# Patient Record
Sex: Female | Born: 1975 | Race: White | Hispanic: No | Marital: Married | Smoking: Current every day smoker
Health system: Southern US, Community
[De-identification: ages and names within clinical notes are randomized; demographics above are authoritative.]

## PROBLEM LIST (undated history)

## (undated) DIAGNOSIS — M51369 Other intervertebral disc degeneration, lumbar region without mention of lumbar back pain or lower extremity pain: Secondary | ICD-10-CM

## (undated) DIAGNOSIS — I340 Nonrheumatic mitral (valve) insufficiency: Secondary | ICD-10-CM

## (undated) DIAGNOSIS — M5126 Other intervertebral disc displacement, lumbar region: Secondary | ICD-10-CM

## (undated) DIAGNOSIS — O24419 Gestational diabetes mellitus in pregnancy, unspecified control: Secondary | ICD-10-CM

## (undated) DIAGNOSIS — M5136 Other intervertebral disc degeneration, lumbar region: Secondary | ICD-10-CM

## (undated) DIAGNOSIS — M549 Dorsalgia, unspecified: Secondary | ICD-10-CM

## (undated) DIAGNOSIS — M069 Rheumatoid arthritis, unspecified: Secondary | ICD-10-CM

## (undated) DIAGNOSIS — I509 Heart failure, unspecified: Secondary | ICD-10-CM

## (undated) HISTORY — DX: Other intervertebral disc degeneration, lumbar region: M51.36

## (undated) HISTORY — DX: Other intervertebral disc degeneration, lumbar region without mention of lumbar back pain or lower extremity pain: M51.369

## (undated) HISTORY — DX: Other intervertebral disc displacement, lumbar region: M51.26

## (undated) HISTORY — DX: Gestational diabetes mellitus in pregnancy, unspecified control: O24.419

---

## 2002-03-09 HISTORY — PX: CHOLECYSTECTOMY: SHX55

## 2013-10-24 ENCOUNTER — Encounter (HOSPITAL_COMMUNITY): Payer: Self-pay | Admitting: Emergency Medicine

## 2013-10-24 ENCOUNTER — Emergency Department (HOSPITAL_COMMUNITY)
Admission: EM | Admit: 2013-10-24 | Discharge: 2013-10-24 | Disposition: A | Discharge status: Home / Self Care | Payer: Medicaid Other | Attending: Emergency Medicine | Admitting: Emergency Medicine

## 2013-10-24 ENCOUNTER — Emergency Department (HOSPITAL_COMMUNITY): Payer: Medicaid Other

## 2013-10-24 DIAGNOSIS — R05 Cough: Secondary | ICD-10-CM | POA: Diagnosis present

## 2013-10-24 DIAGNOSIS — Z880 Allergy status to penicillin: Secondary | ICD-10-CM | POA: Insufficient documentation

## 2013-10-24 DIAGNOSIS — J069 Acute upper respiratory infection, unspecified: Secondary | ICD-10-CM | POA: Diagnosis not present

## 2013-10-24 DIAGNOSIS — Z720 Tobacco use: Secondary | ICD-10-CM | POA: Insufficient documentation

## 2013-10-24 HISTORY — DX: Dorsalgia, unspecified: M54.9

## 2013-10-24 MED ORDER — BENZONATATE 100 MG PO CAPS: 100.0000 mg | ORAL_CAPSULE | Freq: Three times a day (TID) | ORAL | Status: DC

## 2013-10-24 MED ORDER — ALBUTEROL SULFATE HFA 108 (90 BASE) MCG/ACT IN AERS: 2.0000 | INHALATION_SPRAY | RESPIRATORY_TRACT | Status: AC | PRN

## 2013-10-24 NOTE — ED Notes (Signed)
MD at bedside. 

## 2013-10-24 NOTE — ED Provider Notes (Signed)
CSN: [id number]     Arrival date & time 10/24/13  1711 History  This chart was scribed for Kristine Mckenzie, * by Modena Jansky, ED Scribe. This patient was seen in room APA05/[facility]05 and the patient's care was started at 5:23 PM.   Chief Complaint  Patient presents with  . Cough   The history is provided by the patient. No language interpreter was used.   HPI Comments: Kristine Mckenzie is a 38 y.o. female who presents to the Emergency Department complaining of moderate intermittent cough that started about 3 days. She reports that the cough is productive of yellow/green sputum. She states that she has associated wheezing and constant moderate rib pain that radiates to her back. She reports no modifying factors. She denies any hx of asthma or respiratory problems.   Past Medical History  Diagnosis Date  . Back pain    Past Surgical History  Procedure Laterality Date  . Cholecystectomy     History reviewed. No pertinent family history. History  Substance Use Topics  . Smoking status: Current Every Day Smoker -- 1.00 packs/day    Types: Cigarettes  . Smokeless tobacco: Not on file  . Alcohol Use: No   OB History   Grav Para Term Preterm Abortions TAB SAB Ect Mult Living                 Review of Systems  Respiratory: Positive for cough and wheezing.   Musculoskeletal: Positive for back pain and myalgias.  All other systems reviewed and are negative.   Allergies  Codeine and Penicillins  Home Medications   Prior to Admission medications   Not on File   BP 143/81  Pulse 100  Temp(Src) 99.2 F (37.3 C) (Oral)  Resp 100  Ht 5\' 4"  (1.626 m)  Wt 240 lb (108.863 kg)  BMI 41.18 kg/m2  SpO2 100%  LMP 10/20/2013 Physical Exam  Nursing note and vitals reviewed. Constitutional: She is oriented to person, place, and time. She appears well-developed and well-nourished. No distress.  HENT:  Head: Normocephalic and atraumatic.  Right Ear: Hearing normal.  Left Ear:  Hearing normal.  Nose: Nose normal.  Mouth/Throat: Oropharynx is clear and moist and mucous membranes are normal.  Eyes: Conjunctivae and EOM are normal. Pupils are equal, round, and reactive to light.  Neck: Normal range of motion. Neck supple.  Cardiovascular: Regular rhythm, S1 normal and S2 normal.  Exam reveals no gallop and no friction rub.   No murmur heard. Pulmonary/Chest: Effort normal and breath sounds normal. No respiratory distress. She has no wheezes. She exhibits no tenderness.  Abdominal: Soft. Normal appearance and bowel sounds are normal. There is no hepatosplenomegaly. There is no tenderness. There is no rebound, no guarding, no tenderness at McBurney's point and negative Murphy's sign. No hernia.  Musculoskeletal: Normal range of motion.  Neurological: She is alert and oriented to person, place, and time. She has normal strength. No cranial nerve deficit or sensory deficit. Coordination normal. GCS eye subscore is 4. GCS verbal subscore is 5. GCS motor subscore is 6.  Skin: Skin is warm, dry and intact. No rash noted. No cyanosis.  Psychiatric: She has a normal mood and affect. Her speech is normal and behavior is normal. Thought content normal.    ED Course  Procedures (including critical care time) DIAGNOSTIC STUDIES: Oxygen Saturation is 100% on RA, normal by my interpretation.    COORDINATION OF CARE: 5:27 PM- Pt advised of plan for treatment which includes  radiology and pt agrees.  Labs Review Labs Reviewed - No data to display  Imaging Review Dg Chest 2 View  10/24/2013   CLINICAL DATA:  Productive cough for the past 2 days. Smoker. Fever.  EXAM: CHEST  2 VIEW  COMPARISON:  None.  FINDINGS: Normal sized heart. Clear lungs. Thoracic spine degenerative changes. Cholecystectomy clips.  IMPRESSION: No acute abnormality.   Electronically Signed   By: Gordan Payment M.D.   On: 10/24/2013 18:04     EKG Interpretation None      MDM   Final diagnoses:  None    upper respiratory infection  Presents to ER for evaluation of 3 days of cough with thick yellow sputum. She feels that she has been wheezing. Patient complaining of pain in her ribs when she coughs. Lungs are clear to auscultation currently. Chest x-ray is clear. No evidence of pneumonia. Will treat with albuterol, Hycodan, Tessalon Perles.  I personally performed the services described in this documentation, which was scribed in my presence. The recorded information has been reviewed and is accurate.      Kristine Crease, MD 10/24/13 [phone]

## 2013-10-24 NOTE — Discharge Instructions (Signed)
Upper Respiratory Infection, Adult An upper respiratory infection (URI) is also sometimes known as the common cold. The upper respiratory tract includes the nose, sinuses, throat, trachea, and bronchi. Bronchi are the airways leading to the lungs. Most people improve within 1 week, but symptoms can last up to 2 weeks. A residual cough may last even longer.  CAUSES Many different viruses can infect the tissues lining the upper respiratory tract. The tissues become irritated and inflamed and often become very moist. Mucus production is also common. A cold is contagious. You can easily spread the virus to others by oral contact. This includes kissing, sharing a glass, coughing, or sneezing. Touching your mouth or nose and then touching a surface, which is then touched by another person, can also spread the virus. SYMPTOMS  Symptoms typically develop 1 to 3 days after you come in contact with a cold virus. Symptoms vary from person to person. They may include:  Runny nose.  Sneezing.  Nasal congestion.  Sinus irritation.  Sore throat.  Loss of voice (laryngitis).  Cough.  Fatigue.  Muscle aches.  Loss of appetite.  Headache.  Low-grade fever. DIAGNOSIS  You might diagnose your own cold based on familiar symptoms, since most people get a cold 2 to 3 times a year. Your caregiver can confirm this based on your exam. Most importantly, your caregiver can check that your symptoms are not due to another disease such as strep throat, sinusitis, pneumonia, asthma, or epiglottitis. Blood tests, throat tests, and X-rays are not necessary to diagnose a common cold, but they may sometimes be helpful in excluding other more serious diseases. Your caregiver will decide if any further tests are required. RISKS AND COMPLICATIONS  You may be at risk for a more severe case of the common cold if you smoke cigarettes, have chronic heart disease (such as heart failure) or lung disease (such as asthma), or if  you have a weakened immune system. The very young and very old are also at risk for more serious infections. Bacterial sinusitis, middle ear infections, and bacterial pneumonia can complicate the common cold. The common cold can worsen asthma and chronic obstructive pulmonary disease (COPD). Sometimes, these complications can require emergency medical care and may be life-threatening. PREVENTION  The best way to protect against getting a cold is to practice good hygiene. Avoid oral or hand contact with people with cold symptoms. Wash your hands often if contact occurs. There is no clear evidence that vitamin C, vitamin E, echinacea, or exercise reduces the chance of developing a cold. However, it is always recommended to get plenty of rest and practice good nutrition. TREATMENT  Treatment is directed at relieving symptoms. There is no cure. Antibiotics are not effective, because the infection is caused by a virus, not by bacteria. Treatment may include:  Increased fluid intake. Sports drinks offer valuable electrolytes, sugars, and fluids.  Breathing heated mist or steam (vaporizer or shower).  Eating chicken soup or other clear broths, and maintaining good nutrition.  Getting plenty of rest.  Using gargles or lozenges for comfort.  Controlling fevers with ibuprofen or acetaminophen as directed by your caregiver.  Increasing usage of your inhaler if you have asthma. Zinc gel and zinc lozenges, taken in the first 24 hours of the common cold, can shorten the duration and lessen the severity of symptoms. Pain medicines may help with fever, muscle aches, and throat pain. A variety of non-prescription medicines are available to treat congestion and runny nose. Your caregiver   can make recommendations and may suggest nasal or lung inhalers for other symptoms.  HOME CARE INSTRUCTIONS   Only take over-the-counter or prescription medicines for pain, discomfort, or fever as directed by your  caregiver.  Use a warm mist humidifier or inhale steam from a shower to increase air moisture. This may keep secretions moist and make it easier to breathe.  Drink enough water and fluids to keep your urine clear or pale yellow.  Rest as needed.  Return to work when your temperature has returned to normal or as your caregiver advises. You may need to stay home longer to avoid infecting others. You can also use a face mask and careful hand washing to prevent spread of the virus. SEEK MEDICAL CARE IF:   After the first few days, you feel you are getting worse rather than better.  You need your caregiver's advice about medicines to control symptoms.  You develop chills, worsening shortness of breath, or brown or red sputum. These may be signs of pneumonia.  You develop yellow or brown nasal discharge or pain in the face, especially when you bend forward. These may be signs of sinusitis.  You develop a fever, swollen neck glands, pain with swallowing, or white areas in the back of your throat. These may be signs of strep throat. SEEK IMMEDIATE MEDICAL CARE IF:   You have a fever.  You develop severe or persistent headache, ear pain, sinus pain, or chest pain.  You develop wheezing, a prolonged cough, cough up blood, or have a change in your usual mucus (if you have chronic lung disease).  You develop sore muscles or a stiff neck. Document Released: 06/20/2000 Document Revised: 03/19/2011 Document Reviewed: 04/01/2013 ExitCare Patient Information 2015 [employer]. This information is not intended to replace advice given to you by your health care provider. Make sure you discuss any questions you have with your health care provider.  

## 2013-10-24 NOTE — ED Notes (Signed)
Pt has productive cough x 3 days, yellow/green sputum, co rib pain that shoots into middle back.

## 2014-08-22 ENCOUNTER — Emergency Department (HOSPITAL_COMMUNITY): Payer: Medicaid Other

## 2014-08-22 ENCOUNTER — Encounter (HOSPITAL_COMMUNITY): Payer: Self-pay | Admitting: *Deleted

## 2014-08-22 ENCOUNTER — Emergency Department (HOSPITAL_COMMUNITY)
Admission: EM | Admit: 2014-08-22 | Discharge: 2014-08-22 | Disposition: A | Discharge status: Home / Self Care | Payer: Medicaid Other | Attending: Emergency Medicine | Admitting: Emergency Medicine

## 2014-08-22 DIAGNOSIS — Z720 Tobacco use: Secondary | ICD-10-CM | POA: Insufficient documentation

## 2014-08-22 DIAGNOSIS — Z880 Allergy status to penicillin: Secondary | ICD-10-CM | POA: Insufficient documentation

## 2014-08-22 DIAGNOSIS — Z79899 Other long term (current) drug therapy: Secondary | ICD-10-CM | POA: Diagnosis not present

## 2014-08-22 DIAGNOSIS — R05 Cough: Secondary | ICD-10-CM | POA: Diagnosis present

## 2014-08-22 DIAGNOSIS — H9209 Otalgia, unspecified ear: Secondary | ICD-10-CM | POA: Insufficient documentation

## 2014-08-22 DIAGNOSIS — J069 Acute upper respiratory infection, unspecified: Secondary | ICD-10-CM

## 2014-08-22 DIAGNOSIS — M069 Rheumatoid arthritis, unspecified: Secondary | ICD-10-CM | POA: Insufficient documentation

## 2014-08-22 DIAGNOSIS — B9789 Other viral agents as the cause of diseases classified elsewhere: Secondary | ICD-10-CM

## 2014-08-22 DIAGNOSIS — L539 Erythematous condition, unspecified: Secondary | ICD-10-CM | POA: Diagnosis not present

## 2014-08-22 HISTORY — DX: Rheumatoid arthritis, unspecified: M06.9

## 2014-08-22 MED ORDER — BENZONATATE 100 MG PO CAPS: 200.0000 mg | ORAL_CAPSULE | Freq: Three times a day (TID) | ORAL | Status: DC | PRN

## 2014-08-22 MED ORDER — IBUPROFEN 800 MG PO TABS
800.0000 mg | ORAL_TABLET | Freq: Once | ORAL | Status: AC
  Administered 2014-08-22: 800 mg via ORAL
  Filled 2014-08-22: qty 1

## 2014-08-22 MED ORDER — BENZONATATE 100 MG PO CAPS
200.0000 mg | ORAL_CAPSULE | Freq: Once | ORAL | Status: AC
  Administered 2014-08-22: 200 mg via ORAL
  Filled 2014-08-22: qty 2

## 2014-08-22 MED ORDER — IBUPROFEN 600 MG PO TABS: 600.0000 mg | ORAL_TABLET | Freq: Four times a day (QID) | ORAL | Status: DC | PRN

## 2014-08-22 NOTE — ED Notes (Signed)
Sore throat, fever, bilateral ear pain, sneezing, productive cough- while/yellowish in color. Symptoms began satruday evening.

## 2014-08-22 NOTE — Discharge Instructions (Signed)
Upper Respiratory Infection, Adult An upper respiratory infection (URI) is also sometimes known as the common cold. The upper respiratory tract includes the nose, sinuses, throat, trachea, and bronchi. Bronchi are the airways leading to the lungs. Most people improve within 1 week, but symptoms can last up to 2 weeks. A residual cough may last even longer.  CAUSES Many different viruses can infect the tissues lining the upper respiratory tract. The tissues become irritated and inflamed and often become very moist. Mucus production is also common. A cold is contagious. You can easily spread the virus to others by oral contact. This includes kissing, sharing a glass, coughing, or sneezing. Touching your mouth or nose and then touching a surface, which is then touched by another person, can also spread the virus. SYMPTOMS  Symptoms typically develop 1 to 3 days after you come in contact with a cold virus. Symptoms vary from person to person. They may include:  Runny nose.  Sneezing.  Nasal congestion.  Sinus irritation.  Sore throat.  Loss of voice (laryngitis).  Cough.  Fatigue.  Muscle aches.  Loss of appetite.  Headache.  Low-grade fever. DIAGNOSIS  You might diagnose your own cold based on familiar symptoms, since most people get a cold 2 to 3 times a year. Your caregiver can confirm this based on your exam. Most importantly, your caregiver can check that your symptoms are not due to another disease such as strep throat, sinusitis, pneumonia, asthma, or epiglottitis. Blood tests, throat tests, and X-rays are not necessary to diagnose a common cold, but they may sometimes be helpful in excluding other more serious diseases. Your caregiver will decide if any further tests are required. RISKS AND COMPLICATIONS  You may be at risk for a more severe case of the common cold if you smoke cigarettes, have chronic heart disease (such as heart failure) or lung disease (such as asthma), or if  you have a weakened immune system. The very young and very old are also at risk for more serious infections. Bacterial sinusitis, middle ear infections, and bacterial pneumonia can complicate the common cold. The common cold can worsen asthma and chronic obstructive pulmonary disease (COPD). Sometimes, these complications can require emergency medical care and may be life-threatening. PREVENTION  The best way to protect against getting a cold is to practice good hygiene. Avoid oral or hand contact with people with cold symptoms. Wash your hands often if contact occurs. There is no clear evidence that vitamin C, vitamin E, echinacea, or exercise reduces the chance of developing a cold. However, it is always recommended to get plenty of rest and practice good nutrition. TREATMENT  Treatment is directed at relieving symptoms. There is no cure. Antibiotics are not effective, because the infection is caused by a virus, not by bacteria. Treatment may include:  Increased fluid intake. Sports drinks offer valuable electrolytes, sugars, and fluids.  Breathing heated mist or steam (vaporizer or shower).  Eating chicken soup or other clear broths, and maintaining good nutrition.  Getting plenty of rest.  Using gargles or lozenges for comfort.  Controlling fevers with ibuprofen or acetaminophen as directed by your caregiver.  Increasing usage of your inhaler if you have asthma. Zinc gel and zinc lozenges, taken in the first 24 hours of the common cold, can shorten the duration and lessen the severity of symptoms. Pain medicines may help with fever, muscle aches, and throat pain. A variety of non-prescription medicines are available to treat congestion and runny nose. Your caregiver   can make recommendations and may suggest nasal or lung inhalers for other symptoms.  HOME CARE INSTRUCTIONS   Only take over-the-counter or prescription medicines for pain, discomfort, or fever as directed by your  caregiver.  Use a warm mist humidifier or inhale steam from a shower to increase air moisture. This may keep secretions moist and make it easier to breathe.  Drink enough water and fluids to keep your urine clear or pale yellow.  Rest as needed.  Return to work when your temperature has returned to normal or as your caregiver advises. You may need to stay home longer to avoid infecting others. You can also use a face mask and careful hand washing to prevent spread of the virus. SEEK MEDICAL CARE IF:   After the first few days, you feel you are getting worse rather than better.  You need your caregiver's advice about medicines to control symptoms.  You develop chills, worsening shortness of breath, or brown or red sputum. These may be signs of pneumonia.  You develop yellow or brown nasal discharge or pain in the face, especially when you bend forward. These may be signs of sinusitis.  You develop a fever, swollen neck glands, pain with swallowing, or white areas in the back of your throat. These may be signs of strep throat. SEEK IMMEDIATE MEDICAL CARE IF:   You have a fever.  You develop severe or persistent headache, ear pain, sinus pain, or chest pain.  You develop wheezing, a prolonged cough, cough up blood, or have a change in your usual mucus (if you have chronic lung disease).  You develop sore muscles or a stiff neck. Document Released: 06/20/2000 Document Revised: 03/19/2011 Document Reviewed: 04/01/2013 ExitCare Patient Information 2015 [employer]. This information is not intended to replace advice given to you by your health care provider. Make sure you discuss any questions you have with your health care provider.  

## 2014-08-24 NOTE — ED Provider Notes (Signed)
CSN: [id number]     Arrival date & time 08/22/14  0750 History   First MD Initiated Contact with Patient 08/22/14 0809     Chief Complaint  Patient presents with  . Cough     (Consider location/radiation/quality/duration/timing/severity/associated sxs/prior Treatment) The history is provided by the patient.   Kristine Mckenzie is a 39 y.o. female presenting with a 1 day history of uri type symptoms which includes nasal congestion with clear rhinorrhea, sore throat, bilateral ear pain and cough productive of white to yellow sputum.  Symptoms due to not include shortness of breath, wheezing, chest pain,  Nausea, vomiting or diarrhea.  The patient has taken no  prior to arrival with no significant improvement in symptoms.  She is a daily smoker.  Her daughter is also here for evaluation of similar symptoms.      Past Medical History  Diagnosis Date  . Back pain   . RA (rheumatoid arthritis)    Past Surgical History  Procedure Laterality Date  . Cholecystectomy     No family history on file. Social History  Substance Use Topics  . Smoking status: Current Every Day Smoker -- 1.00 packs/day    Types: Cigarettes  . Smokeless tobacco: None  . Alcohol Use: No   OB History    No data available     Review of Systems  Constitutional: Negative for fever and chills.  HENT: Positive for congestion, ear pain, rhinorrhea and sore throat. Negative for sinus pressure, trouble swallowing and voice change.   Eyes: Negative for discharge.  Respiratory: Positive for cough. Negative for shortness of breath, wheezing and stridor.   Cardiovascular: Negative for chest pain.  Gastrointestinal: Negative for abdominal pain.  Genitourinary: Negative.       Allergies  Codeine and Penicillins  Home Medications   Prior to Admission medications   Medication Sig Start Date End Date Taking? Authorizing Provider  albuterol (PROVENTIL HFA;VENTOLIN HFA) 108 (90 BASE) MCG/ACT inhaler Inhale 2 puffs  into the lungs every 4 (four) hours as needed for wheezing or shortness of breath. 10/24/13  Yes Gilda Crease, MD  benzonatate (TESSALON) 100 MG capsule Take 2 capsules (200 mg total) by mouth 3 (three) times daily as needed. 08/22/14   Burgess Amor, PA-C  ibuprofen (ADVIL,MOTRIN) 600 MG tablet Take 1 tablet (600 mg total) by mouth every 6 (six) hours as needed. 08/22/14   Burgess Amor, PA-C   BP 133/57 mmHg  Pulse 88  Temp(Src) 98 F (36.7 C) (Oral)  Resp 18  Ht 5\' 4"  (1.626 m)  Wt 230 lb (104.327 kg)  BMI 39.46 kg/m2  SpO2 98%  LMP 08/15/2014 Physical Exam  Constitutional: She is oriented to person, place, and time. She appears well-developed and well-nourished.  HENT:  Head: Normocephalic and atraumatic.  Right Ear: Tympanic membrane and ear canal normal.  Left Ear: Tympanic membrane and ear canal normal.  Nose: Rhinorrhea present. No mucosal edema.  Mouth/Throat: Uvula is midline and mucous membranes are normal. Posterior oropharyngeal erythema present. No oropharyngeal exudate, posterior oropharyngeal edema or tonsillar abscesses.  Mild erythema, no edema, no exudate.  Eyes: Conjunctivae are normal.  Cardiovascular: Normal rate and normal heart sounds.   Pulmonary/Chest: Effort normal. No respiratory distress. She has no wheezes. She has no rales.  Abdominal: Soft. There is no tenderness.  Musculoskeletal: Normal range of motion.  Neurological: She is alert and oriented to person, place, and time.  Skin: Skin is warm and dry. No rash noted.  Psychiatric: She  has a normal mood and affect.    ED Course  Procedures (including critical care time) Labs Review Labs Reviewed - No data to display  Imaging Review   No results found for this or any previous visit. Dg Chest 2 View  08/22/2014   CLINICAL DATA:  Cough, fever.  EXAM: CHEST  2 VIEW  COMPARISON:  October 24, 2013.  FINDINGS: The heart size and mediastinal contours are within normal limits. Both lungs are clear. No  pneumothorax or pleural effusion is noted. Mild multilevel degenerative disc disease is noted in the lower thoracic spine.  IMPRESSION: No active cardiopulmonary disease.   Electronically Signed   By: Lupita Raider, M.D.   On: 08/22/2014 10:21      MDM   Final diagnoses:  Viral URI with cough    Radiological studies were viewed, interpreted and considered during the medical decision making and disposition process. I agree with radiologists reading.  Results were also discussed with patient.   Pt with exam suggesting viral uri.  Tessalon, ibuprofen, rest, increased fluid intake.  F/u with pcp if sx persist or worsen.  The patient appears reasonably screened and/or stabilized for discharge and I doubt any other medical condition or other [facility] requiring further screening, evaluation, or treatment in the ED at this time prior to discharge.    Burgess Amor, PA-C 08/24/14 1346  Bethann Berkshire, MD 08/26/14 1020

## 2016-09-05 NOTE — Congregational Nurse Program (Signed)
Congregational Nurse Program Note  Date of Encounter: 09/05/2016  Past Medical History: Past Medical History:  Diagnosis Date  . Back pain   . RA (rheumatoid arthritis)     Encounter Details:     CNP Questionnaire - 09/05/16 1210      Patient Demographics   Is this a new or existing patient? New   Patient is considered a/an Not Applicable   Race Caucasian/White     Patient Assistance   Location of Patient Assistance [place]   Patient's financial/insurance status Self-Pay (Uninsured)   Uninsured Patient ([place] [employer]) No   Patient referred to apply for the following financial assistance Not Applicable   Food insecurities addressed Not Applicable   Transportation assistance No   Assistance securing medications No   Educational Programmer, systems the healthcare system     Encounter Details   Primary purpose of visit Other;Navigating the Healthcare System   Was an Emergency Department visit averted? Not Applicable   Does patient have a medical provider? No   Patient referred to Area Agency   Was a mental health screening completed? (GAINS tool) No   Does patient have dental issues? Yes   Was a dental referral made? No   Does patient have vision issues? Yes   Was a vision referral made? Yes   Does your patient have an abnormal blood pressure today? No   Since previous encounter, have you referred patient for abnormal blood pressure that resulted in a new diagnosis or medication change? No   Does your patient have an abnormal blood glucose today? No   Since previous encounter, have you referred patient for abnormal blood glucose that resulted in a new diagnosis or medication change? No   Was there a life-saving intervention made? No     Client was seen at the [facility] at a dental clinic. A medical history was taken and client was in need of a primarycare provider. Client was referred to the [facility] in  [place], [place]. An appointment is scheduled for 09/17/16 at 10:00 am. Client was also given information about [employer] to assist with an eye exam/glasses. Instructed to call if any questions or concerns.  Pearletha Alfred [phone].

## 2016-09-17 ENCOUNTER — Ambulatory Visit: Payer: Self-pay | Admitting: Physician Assistant

## 2016-09-17 ENCOUNTER — Encounter: Payer: Self-pay | Admitting: Physician Assistant

## 2016-09-17 VITALS — BP 152/90 | HR 92 | Temp 97.7°F | Ht 63.25 in | Wt 242.5 lb

## 2016-09-17 DIAGNOSIS — Z8739 Personal history of other diseases of the musculoskeletal system and connective tissue: Secondary | ICD-10-CM

## 2016-09-17 DIAGNOSIS — Z1329 Encounter for screening for other suspected endocrine disorder: Secondary | ICD-10-CM

## 2016-09-17 DIAGNOSIS — F17210 Nicotine dependence, cigarettes, uncomplicated: Secondary | ICD-10-CM

## 2016-09-17 DIAGNOSIS — Z131 Encounter for screening for diabetes mellitus: Secondary | ICD-10-CM

## 2016-09-17 DIAGNOSIS — M549 Dorsalgia, unspecified: Secondary | ICD-10-CM

## 2016-09-17 DIAGNOSIS — M2550 Pain in unspecified joint: Secondary | ICD-10-CM

## 2016-09-17 DIAGNOSIS — Z13220 Encounter for screening for lipoid disorders: Secondary | ICD-10-CM

## 2016-09-17 DIAGNOSIS — Z130 Encounter for screening for diseases of the blood and blood-forming organs and certain disorders involving the immune mechanism: Secondary | ICD-10-CM

## 2016-09-17 DIAGNOSIS — F32A Depression, unspecified: Secondary | ICD-10-CM

## 2016-09-17 DIAGNOSIS — G8929 Other chronic pain: Secondary | ICD-10-CM

## 2016-09-17 DIAGNOSIS — R011 Cardiac murmur, unspecified: Secondary | ICD-10-CM

## 2016-09-17 DIAGNOSIS — I10 Essential (primary) hypertension: Secondary | ICD-10-CM

## 2016-09-17 DIAGNOSIS — F329 Major depressive disorder, single episode, unspecified: Secondary | ICD-10-CM

## 2016-09-17 NOTE — Progress Notes (Signed)
BP (!) 156/86 (BP Location: Left Arm, Patient Position: Sitting, Cuff Size: Normal)   Pulse 92   Temp 97.7 F (36.5 C)   Ht 5' 3.25" (1.607 m)   Wt 242 lb 8 oz (110 kg)   SpO2 98%   BMI 42.62 kg/m    Subjective:    Patient ID: Kristine Mckenzie, female    DOB: May 22, 1975, 41 y.o.   MRN: [id number]  HPI: Kristine Mckenzie is a 41 y.o. female presenting on 09/17/2016 for New Patient (Initial Visit) (pt states she is fro [place] and came to [place] 3 years ago and has not seen a provider since) and Back Pain (pt states she had hip fracture and vertebrea fracture 3 years ago. pt states she never recieved treatment and c/o pain)   HPI   Chief Complaint  Patient presents with  . New Patient (Initial Visit)    pt states she is fro [place] and came to [place] 3 years ago and has not seen a provider since  . Back Pain    pt states she had hip fracture and vertebrea fracture 3 years ago. pt states she never recieved treatment and c/o pain   Pt dx with RA in [place] 3 years ago.    She says her Last PAP was about 3 1/2 years ago in [facility] - and it was normal. - she has family planning medicaid.   Pt thinks she was dropped off OR table when she had her cholecystectomy.  Pt was seen by orthopedist in [facility] who recommended surgery for her hip and for her back.   she saw dr Aretha Parrot who gave her rx and did spinal injections but she did not like him because he didn't recommend surgerey.  She saw dr Tretha Sciara who recommneded that she need multiple surgeries.  She liked him because she says he recommended several surgeries that she would need.   She did not get the surgery done because she moved.   Pt says she only uses her inhaler when she gets sick.   Relevant past medical, surgical, family and social history reviewed and updated as indicated. Interim medical history since our last visit reviewed. Allergies and medications reviewed and updated.   Current Outpatient Prescriptions:  .  albuterol  (PROVENTIL HFA;VENTOLIN HFA) 108 (90 BASE) MCG/ACT inhaler, Inhale 2 puffs into the lungs every 4 (four) hours as needed for wheezing or shortness of breath., Disp: 1 Inhaler, Rfl: 2  Review of Systems  Constitutional: Positive for diaphoresis and fatigue. Negative for appetite change, chills, fever and unexpected weight change.  HENT: Positive for congestion and dental problem. Negative for drooling, ear pain, facial swelling, hearing loss, mouth sores, sneezing, sore throat, trouble swallowing and voice change.   Eyes: Positive for visual disturbance. Negative for pain, discharge, redness and itching.  Respiratory: Positive for cough and wheezing. Negative for choking and shortness of breath.   Cardiovascular: Negative for chest pain, palpitations and leg swelling.  Gastrointestinal: Negative for abdominal pain, blood in stool, constipation, diarrhea and vomiting.  Endocrine: Negative for cold intolerance, heat intolerance and polydipsia.  Genitourinary: Negative for decreased urine volume, dysuria and hematuria.  Musculoskeletal: Positive for arthralgias, back pain and gait problem.  Skin: Negative for rash.  Allergic/Immunologic: Negative for environmental allergies.  Neurological: Positive for headaches. Negative for seizures, syncope and light-headedness.  Hematological: Negative for adenopathy.  Psychiatric/Behavioral: Positive for dysphoric mood. Negative for agitation and suicidal ideas. The patient is nervous/anxious.     Per  HPI unless specifically indicated above     Objective:    BP (!) 156/86 (BP Location: Left Arm, Patient Position: Sitting, Cuff Size: Normal)   Pulse 92   Temp 97.7 F (36.5 C)   Ht 5' 3.25" (1.607 m)   Wt 242 lb 8 oz (110 kg)   SpO2 98%   BMI 42.62 kg/m   Wt Readings from Last 3 Encounters:  09/17/16 242 lb 8 oz (110 kg)  08/22/14 230 lb (104.3 kg)  10/24/13 240 lb (108.9 kg)    Physical Exam  Constitutional: She is oriented to person, place,  and time. She appears well-developed and well-nourished.  HENT:  Head: Normocephalic and atraumatic.  Mouth/Throat: Oropharynx is clear and moist. No oropharyngeal exudate.  Eyes: Pupils are equal, round, and reactive to light. Conjunctivae and EOM are normal.  Neck: Neck supple. No thyromegaly present.  Cardiovascular: Normal rate and regular rhythm.   Murmur heard. Pulmonary/Chest: Effort normal and breath sounds normal.  Abdominal: Soft. Bowel sounds are normal. She exhibits no mass. There is no hepatosplenomegaly. There is no tenderness.  Musculoskeletal: She exhibits no edema.  Lymphadenopathy:    She has no cervical adenopathy.  Neurological: She is alert and oriented to person, place, and time. Gait normal.  Skin: Skin is warm and dry.  Psychiatric: She has a normal mood and affect. Her behavior is normal.  Vitals reviewed.   No results found for this or any previous visit.    Assessment & Plan:   Encounter Diagnoses  Name Primary?  . Essential hypertension Yes  . Chronic midline back pain, unspecified back location   . Screening cholesterol level   . Screening, anemia, deficiency, iron   . Screening for diabetes mellitus   . History of rheumatoid arthritis   . Arthralgia, unspecified joint   . Screening for thyroid disorder   . Depression, unspecified depression type   . Morbid obesity (HCC)   . Murmur, heart   . Cigarette nicotine dependence without complication      -will Get baseline labs -will get Echo to evaluate murmur.  She has never been told that she has a murmur -pt was Given cone discount application -Record request sent to the 2 orthopedists that she saw in [facility] -discussed with pt that we will recheck her bp at follow up OV and if still elevated, will start medication -pt is given contact information for [facility] to get PAP updated since she has family planning medicaid -pt to follow up 1 month.  RTO sooner prn

## 2016-09-17 NOTE — Patient Instructions (Signed)
[facility]    [address] [place], [place] [address]  [phone]

## 2016-09-19 ENCOUNTER — Other Ambulatory Visit: Payer: Self-pay | Admitting: Physician Assistant

## 2016-09-19 DIAGNOSIS — R011 Cardiac murmur, unspecified: Secondary | ICD-10-CM

## 2016-09-25 ENCOUNTER — Ambulatory Visit (HOSPITAL_COMMUNITY)
Admission: RE | Admit: 2016-09-25 | Discharge: 2016-09-25 | Disposition: A | Discharge status: Home / Self Care | Payer: Self-pay | Source: Ambulatory Visit | Attending: Physician Assistant | Admitting: Physician Assistant

## 2016-09-25 DIAGNOSIS — I351 Nonrheumatic aortic (valve) insufficiency: Secondary | ICD-10-CM | POA: Insufficient documentation

## 2016-09-25 DIAGNOSIS — R011 Cardiac murmur, unspecified: Secondary | ICD-10-CM | POA: Insufficient documentation

## 2016-09-25 DIAGNOSIS — I10 Essential (primary) hypertension: Secondary | ICD-10-CM | POA: Insufficient documentation

## 2016-09-25 DIAGNOSIS — I5189 Other ill-defined heart diseases: Secondary | ICD-10-CM | POA: Insufficient documentation

## 2016-09-25 DIAGNOSIS — Z720 Tobacco use: Secondary | ICD-10-CM | POA: Insufficient documentation

## 2016-09-25 NOTE — Progress Notes (Signed)
  Echocardiogram 2D Echocardiogram has been performed.  Kristine Mckenzie M 09/25/2016, 10:55 AM

## 2016-10-05 ENCOUNTER — Other Ambulatory Visit (HOSPITAL_COMMUNITY)
Admission: RE | Admit: 2016-10-05 | Discharge: 2016-10-05 | Disposition: A | Discharge status: Home / Self Care | Payer: Self-pay | Source: Ambulatory Visit | Attending: Physician Assistant | Admitting: Physician Assistant

## 2016-10-05 DIAGNOSIS — M549 Dorsalgia, unspecified: Secondary | ICD-10-CM | POA: Insufficient documentation

## 2016-10-05 DIAGNOSIS — F329 Major depressive disorder, single episode, unspecified: Secondary | ICD-10-CM | POA: Insufficient documentation

## 2016-10-05 DIAGNOSIS — Z8739 Personal history of other diseases of the musculoskeletal system and connective tissue: Secondary | ICD-10-CM | POA: Insufficient documentation

## 2016-10-05 DIAGNOSIS — M2550 Pain in unspecified joint: Secondary | ICD-10-CM | POA: Insufficient documentation

## 2016-10-05 DIAGNOSIS — Z13220 Encounter for screening for lipoid disorders: Secondary | ICD-10-CM | POA: Insufficient documentation

## 2016-10-05 DIAGNOSIS — Z131 Encounter for screening for diabetes mellitus: Secondary | ICD-10-CM | POA: Insufficient documentation

## 2016-10-05 DIAGNOSIS — Z1329 Encounter for screening for other suspected endocrine disorder: Secondary | ICD-10-CM | POA: Insufficient documentation

## 2016-10-05 DIAGNOSIS — I10 Essential (primary) hypertension: Secondary | ICD-10-CM | POA: Insufficient documentation

## 2016-10-05 DIAGNOSIS — G8929 Other chronic pain: Secondary | ICD-10-CM | POA: Insufficient documentation

## 2016-10-05 DIAGNOSIS — F32A Depression, unspecified: Secondary | ICD-10-CM

## 2016-10-05 DIAGNOSIS — Z130 Encounter for screening for diseases of the blood and blood-forming organs and certain disorders involving the immune mechanism: Secondary | ICD-10-CM | POA: Insufficient documentation

## 2016-10-05 LAB — CBC
HCT: 41.1 % (ref 36.0–46.0)
Hemoglobin: 13.7 g/dL (ref 12.0–15.0)
MCH: 30 pg (ref 26.0–34.0)
MCHC: 33.3 g/dL (ref 30.0–36.0)
MCV: 90.1 fL (ref 78.0–100.0)
Platelets: 411 10*3/uL — ABNORMAL HIGH (ref 150–400)
RBC: 4.56 MIL/uL (ref 3.87–5.11)
RDW: 13.6 % (ref 11.5–15.5)
WBC: 9 10*3/uL (ref 4.0–10.5)

## 2016-10-05 LAB — COMPREHENSIVE METABOLIC PANEL
ALT: 23 U/L (ref 14–54)
ANION GAP: 9 (ref 5–15)
AST: 18 U/L (ref 15–41)
Albumin: 3.9 g/dL (ref 3.5–5.0)
Alkaline Phosphatase: 75 U/L (ref 38–126)
BUN: 8 mg/dL (ref 6–20)
CHLORIDE: 101 mmol/L (ref 101–111)
CO2: 26 mmol/L (ref 22–32)
CREATININE: 0.74 mg/dL (ref 0.44–1.00)
Calcium: 9.2 mg/dL (ref 8.9–10.3)
GFR calc non Af Amer: >60 mL/min (ref >60)
Glucose, Bld: 109 mg/dL — ABNORMAL HIGH (ref 65–99)
Potassium: 4 mmol/L (ref 3.5–5.1)
Sodium: 136 mmol/L (ref 135–145)
Total Bilirubin: 0.5 mg/dL (ref 0.3–1.2)
Total Protein: 8 g/dL (ref 6.5–8.1)

## 2016-10-05 LAB — LIPID PANEL
Cholesterol: 189 mg/dL (ref 0–200)
HDL: 40 mg/dL — AB (ref >40)
LDL CALC: 130 mg/dL — AB (ref 0–99)
Total CHOL/HDL Ratio: 4.7 RATIO
Triglycerides: 93 mg/dL (ref <150)
VLDL: 19 mg/dL (ref 0–40)

## 2016-10-05 LAB — SEDIMENTATION RATE: Sed Rate: 39 mm/hr — ABNORMAL HIGH (ref 0–22)

## 2016-10-05 LAB — HEMOGLOBIN A1C
Hgb A1c MFr Bld: 6 % — ABNORMAL HIGH (ref 4.8–5.6)
Mean Plasma Glucose: 125.5 mg/dL

## 2016-10-05 LAB — TSH: TSH: 1.928 u[IU]/mL (ref 0.350–4.500)

## 2016-10-06 LAB — RHEUMATOID FACTOR: Rhuematoid fact SerPl-aCnc: <10 IU/mL (ref 0.0–13.9)

## 2016-10-16 IMAGING — CR DG CHEST 2V
2 series · 2 of 2 positions shown · non-contrast
Comparison: None.

CLINICAL DATA: Productive cough for the past 2 days. Smoker. Fever.

EXAM:
CHEST  2 VIEW

[view not recorded (1 of 2)]
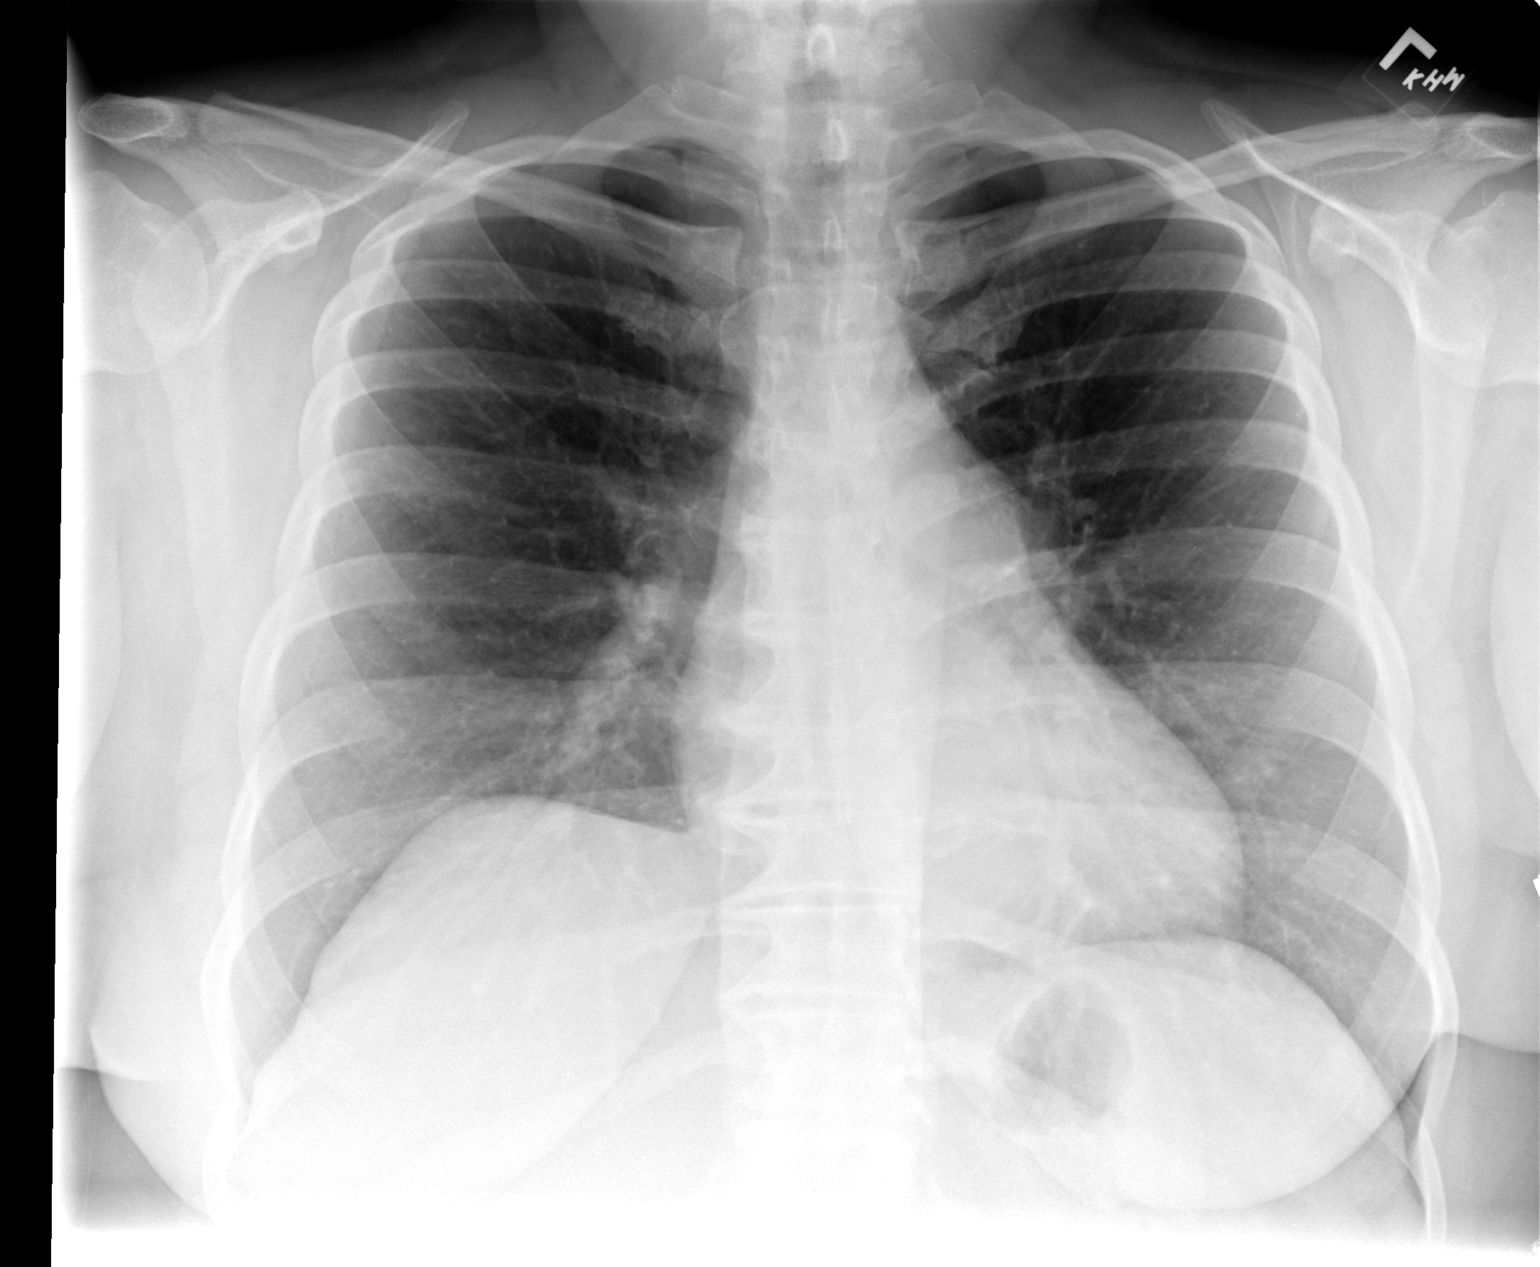

[view not recorded (2 of 2)]
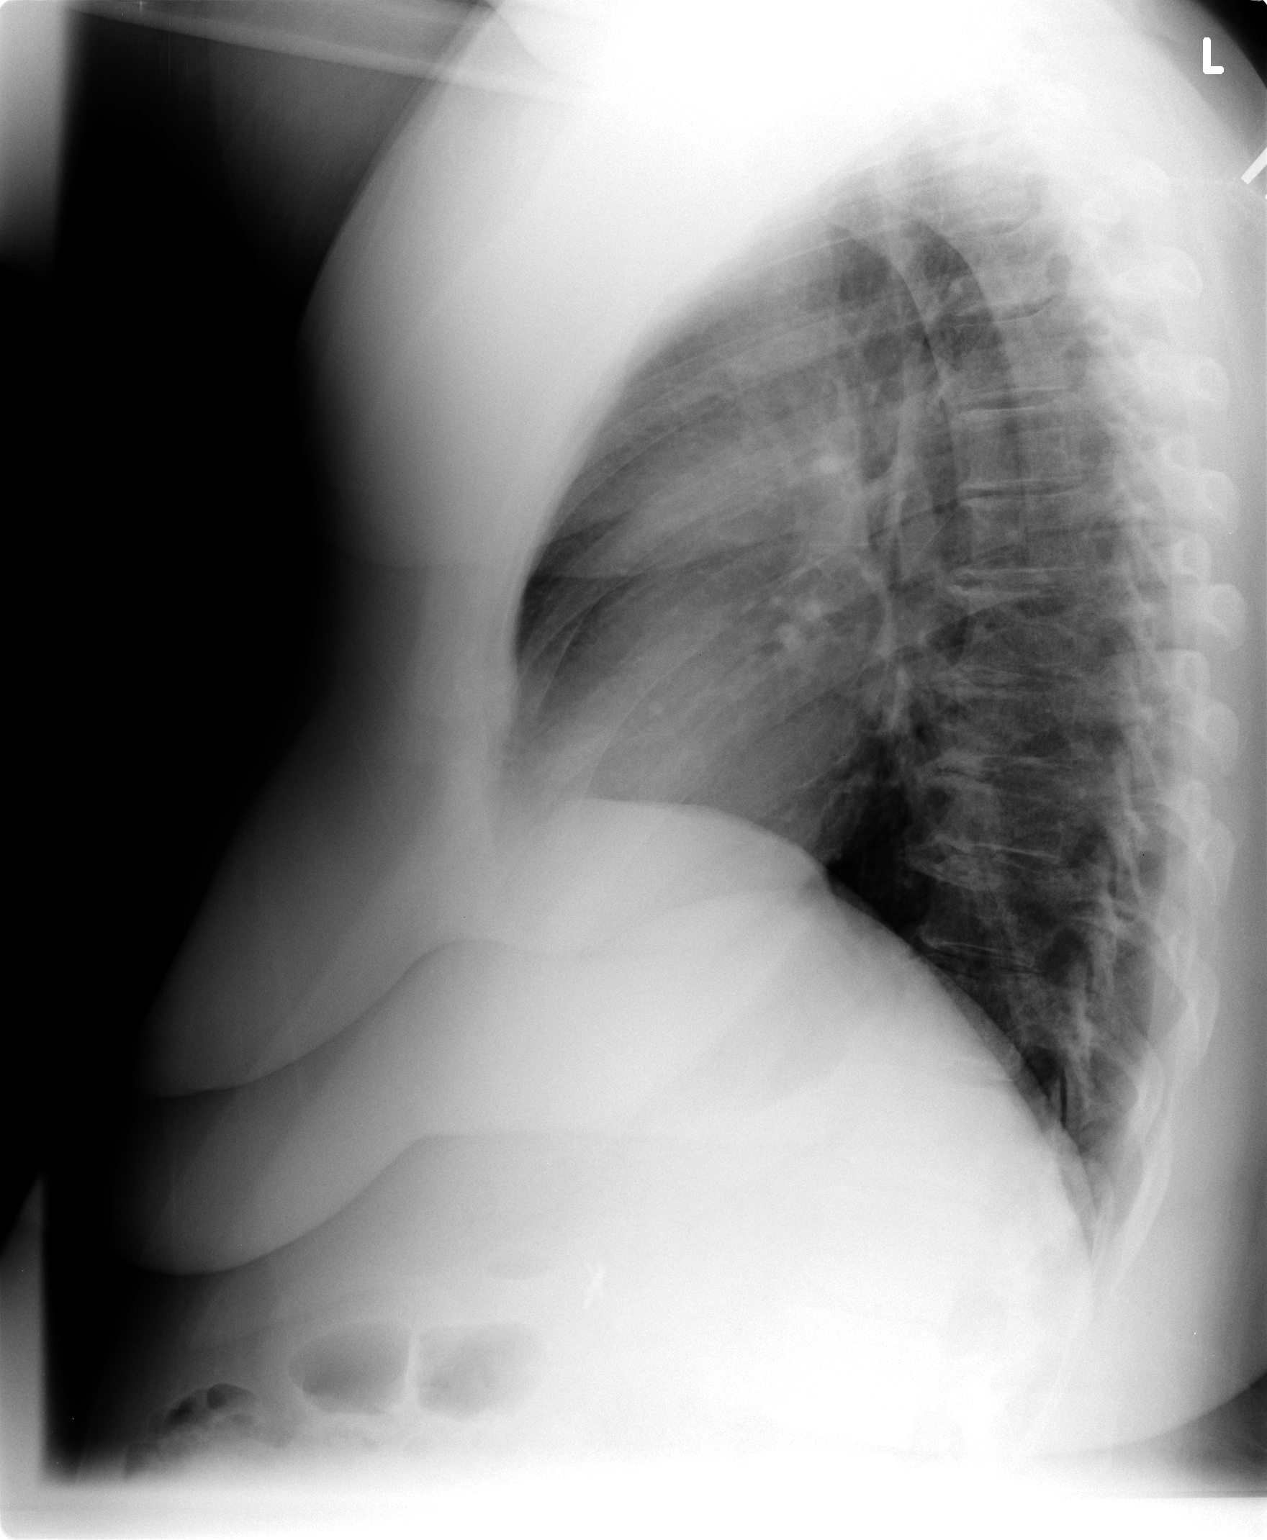

[2 of 2 positions shown; findings below may reference images not displayed]

FINDINGS: Normal sized heart. Clear lungs. Thoracic spine degenerative
changes. Cholecystectomy clips.
IMPRESSION: No acute abnormality.

## 2016-10-18 ENCOUNTER — Encounter: Payer: Self-pay | Admitting: Physician Assistant

## 2016-10-18 ENCOUNTER — Ambulatory Visit: Payer: Self-pay | Admitting: Physician Assistant

## 2016-10-18 VITALS — BP 130/88 | HR 95 | Temp 97.7°F | Ht 63.25 in | Wt 242.0 lb

## 2016-10-18 DIAGNOSIS — G8929 Other chronic pain: Secondary | ICD-10-CM

## 2016-10-18 DIAGNOSIS — M549 Dorsalgia, unspecified: Secondary | ICD-10-CM

## 2016-10-18 DIAGNOSIS — F17219 Nicotine dependence, cigarettes, with unspecified nicotine-induced disorders: Secondary | ICD-10-CM

## 2016-10-18 DIAGNOSIS — J449 Chronic obstructive pulmonary disease, unspecified: Secondary | ICD-10-CM

## 2016-10-18 DIAGNOSIS — I5189 Other ill-defined heart diseases: Secondary | ICD-10-CM

## 2016-10-18 DIAGNOSIS — I351 Nonrheumatic aortic (valve) insufficiency: Secondary | ICD-10-CM

## 2016-10-18 DIAGNOSIS — R7303 Prediabetes: Secondary | ICD-10-CM

## 2016-10-18 DIAGNOSIS — F329 Major depressive disorder, single episode, unspecified: Secondary | ICD-10-CM

## 2016-10-18 DIAGNOSIS — F32A Depression, unspecified: Secondary | ICD-10-CM

## 2016-10-18 DIAGNOSIS — R7989 Other specified abnormal findings of blood chemistry: Secondary | ICD-10-CM

## 2016-10-18 DIAGNOSIS — R011 Cardiac murmur, unspecified: Secondary | ICD-10-CM

## 2016-10-18 DIAGNOSIS — I519 Heart disease, unspecified: Secondary | ICD-10-CM

## 2016-10-18 DIAGNOSIS — E785 Hyperlipidemia, unspecified: Secondary | ICD-10-CM

## 2016-10-18 MED ORDER — CITALOPRAM HYDROBROMIDE 20 MG PO TABS: 20.0000 mg | ORAL_TABLET | Freq: Every day | ORAL | 0 refills | Status: AC

## 2016-10-18 MED ORDER — SIMVASTATIN 20 MG PO TABS: 20.0000 mg | ORAL_TABLET | Freq: Every day | ORAL | 4 refills | Status: AC

## 2016-10-18 NOTE — Progress Notes (Signed)
BP 130/88 (BP Location: Left Arm, Patient Position: Sitting, Cuff Size: Large)   Pulse 95   Temp 97.7 F (36.5 C)   Ht 5' 3.25" (1.607 m)   Wt 242 lb (109.8 kg)   SpO2 98%   BMI 42.53 kg/m    Subjective:    Patient ID: Kristine Mckenzie, female    DOB: Dec 27, 1975, 41 y.o.   MRN: [id number]  HPI: Kristine Mckenzie is a 42 y.o. female presenting on 10/18/2016 for Follow-up   HPI   pt did not schedule PAP as instructed at last OV (she has family planning medicaid). She has contact information still  Pt turned in her [employer] discount application   Discussed with pt that records from 2 previous orthopedists received.  Dr Damaris Schooner - 04/02/13- dx "1- LBP, 2- congenital lumbosacral spondylolysis pars defect".  He recommended referral to pain management.  Dr Kathrin Greathouse- 12/01/12- recommended L5-S1 epidural.    Discussed with pt that this is somewhat in contrast to what she related at initial OV.   Relevant past medical, surgical, family and social history reviewed and updated as indicated. Interim medical history since our last visit reviewed. Allergies and medications reviewed and updated.  CURRENT MEDS: Albuterol MDI- not using it regularly  Review of Systems  Constitutional: Positive for diaphoresis and fatigue. Negative for appetite change, chills, fever and unexpected weight change.  HENT: Positive for dental problem. Negative for congestion, drooling, ear pain, facial swelling, hearing loss, mouth sores, sneezing, sore throat, trouble swallowing and voice change.   Eyes: Negative for pain, discharge, redness, itching and visual disturbance.  Respiratory: Positive for cough and wheezing. Negative for choking and shortness of breath.   Cardiovascular: Negative for chest pain, palpitations and leg swelling.  Gastrointestinal: Negative for abdominal pain, blood in stool, constipation, diarrhea and vomiting.  Endocrine: Negative for cold intolerance, heat intolerance and polydipsia.   Genitourinary: Negative for decreased urine volume, dysuria and hematuria.  Musculoskeletal: Positive for arthralgias, back pain and gait problem.  Skin: Negative for rash.  Allergic/Immunologic: Negative for environmental allergies.  Neurological: Negative for seizures, syncope, light-headedness and headaches.  Hematological: Negative for adenopathy.  Psychiatric/Behavioral: Positive for dysphoric mood and suicidal ideas. Negative for agitation. The patient is nervous/anxious.     Per HPI unless specifically indicated above     Objective:    BP 130/88 (BP Location: Left Arm, Patient Position: Sitting, Cuff Size: Large)   Pulse 95   Temp 97.7 F (36.5 C)   Ht 5' 3.25" (1.607 m)   Wt 242 lb (109.8 kg)   SpO2 98%   BMI 42.53 kg/m   Wt Readings from Last 3 Encounters:  10/18/16 242 lb (109.8 kg)  09/17/16 242 lb 8 oz (110 kg)  08/22/14 230 lb (104.3 kg)     PHQ-9 score 26 GAD-7 score 18   Physical Exam  Constitutional: She is oriented to person, place, and time. She appears well-developed and well-nourished.  HENT:  Head: Normocephalic and atraumatic.  Neck: Neck supple.  Cardiovascular: Normal rate and regular rhythm.   Murmur heard. Pulmonary/Chest: Effort normal and breath sounds normal.  Abdominal: Soft. Bowel sounds are normal. She exhibits no mass. There is no hepatosplenomegaly. There is no tenderness.  Musculoskeletal: She exhibits no edema.  Lymphadenopathy:    She has no cervical adenopathy.  Neurological: She is alert and oriented to person, place, and time.  Skin: Skin is warm and dry.  Psychiatric: She has a normal mood and affect. Her behavior  is normal.  Vitals reviewed.   Results for orders placed or performed during the hospital encounter of 10/05/16  Comprehensive metabolic panel  Result Value Ref Range   Sodium 136 135 - 145 mmol/L   Potassium 4.0 3.5 - 5.1 mmol/L   Chloride 101 101 - 111 mmol/L   CO2 26 22 - 32 mmol/L   Glucose, Bld 109 (H)  65 - 99 mg/dL   BUN 8 6 - 20 mg/dL   Creatinine, Ser 0.74 0.44 - 1.00 mg/dL   Calcium 9.2 8.9 - 10.3 mg/dL   Total Protein 8.0 6.5 - 8.1 g/dL   Albumin 3.9 3.5 - 5.0 g/dL   AST 18 15 - 41 U/L   ALT 23 14 - 54 U/L   Alkaline Phosphatase 75 38 - 126 U/L   Total Bilirubin 0.5 0.3 - 1.2 mg/dL   GFR calc non Af Amer >60 >60 mL/min   GFR calc Af Amer >60 >60 mL/min   Anion gap 9 5 - 15  Lipid Profile  Result Value Ref Range   Cholesterol 189 0 - 200 mg/dL   Triglycerides 93 <150 mg/dL   HDL 40 (L) >40 mg/dL   Total CHOL/HDL Ratio 4.7 RATIO   VLDL 19 0 - 40 mg/dL   LDL Cholesterol 130 (H) 0 - 99 mg/dL  HgB A1c  Result Value Ref Range   Hgb A1c MFr Bld 6.0 (H) 4.8 - 5.6 %   Mean Plasma Glucose 125.5 mg/dL  CBC  Result Value Ref Range   WBC 9.0 4.0 - 10.5 K/uL   RBC 4.56 3.87 - 5.11 MIL/uL   Hemoglobin 13.7 12.0 - 15.0 g/dL   HCT 41.1 36.0 - 46.0 %   MCV 90.1 78.0 - 100.0 fL   MCH 30.0 26.0 - 34.0 pg   MCHC 33.3 30.0 - 36.0 g/dL   RDW 13.6 11.5 - 15.5 %   Platelets 411 (H) 150 - 400 K/uL  Sed Rate (ESR)  Result Value Ref Range   Sed Rate 39 (H) 0 - 22 mm/hr  Rheumatoid Factor  Result Value Ref Range   Rhuematoid fact SerPl-aCnc <10.0 0.0 - 13.9 IU/mL  TSH  Result Value Ref Range   TSH 1.928 0.350 - 4.500 uIU/mL      Assessment & Plan:   Encounter Diagnoses  Name Primary?  . Depression, unspecified depression type Yes  . Prediabetes   . Cigarette nicotine dependence with nicotine-induced disorder   . Chronic obstructive pulmonary disease, unspecified COPD type ([place])   . Elevated platelet count   . Hyperlipidemia, unspecified hyperlipidemia type   . Murmur, heart   . Chronic midline back pain, unspecified back location   . Morbid obesity ([place])   . Grade I diastolic dysfunction   . Mild aortic valve regurgitation     -reviewed labs with pt -Pt to schedule PAP with gyn (she has [employer] medicaid).  -pt is Counseled on prediabetes.  -pt counseled to Use inhaler when  needed -counseled on smoking cessation -Will recheck platelets several months -rx simvastatin and lowfat diet for lipids -bp improved today.  Will continue to monitor it -Reviewed echo with pt- will plan to repeat it 1 year -will Refer to orthopedics for back pain -urged pt to go to [facility] for counseling. Urged her to go to walk in.  Started citalopram but discussed with pt that she will need to get [facility] to take over that when she is seen there. She states understanding.  -pt to  follow up 2-3 weeks to recheck mood.  RTO sooner prn

## 2016-10-18 NOTE — Patient Instructions (Signed)
Prediabetes Prediabetes is the condition of having a blood sugar (blood glucose) level that is higher than it should be, but not high enough for you to be diagnosed with type 2 diabetes. Having prediabetes puts you at risk for developing type 2 diabetes (type 2 diabetes mellitus). Prediabetes may be called impaired glucose tolerance or impaired fasting glucose. Prediabetes usually does not cause symptoms. Your health care provider can diagnose this condition with blood tests. You may be tested for prediabetes if you are overweight and if you have at least one other risk factor for prediabetes. Risk factors for prediabetes include:  Having a family member with type 2 diabetes.  Being overweight or obese.  Being older than age 57.  Being of American-Indian, African-American, Hispanic/Latino, or Asian/Pacific Islander descent.  Having an inactive (sedentary) lifestyle.  Having a history of gestational diabetes or polycystic ovarian syndrome (PCOS).  Having low levels of good cholesterol (HDL-C) or high levels of blood fats (triglycerides).  Having high blood pressure.  What is blood glucose and how is blood glucose measured?  Blood glucose refers to the amount of glucose in your bloodstream. Glucose comes from eating foods that contain sugars and starches (carbohydrates) that the body breaks down into glucose. Your blood glucose level may be measured in mg/dL (milligrams per deciliter) or mmol/L (millimoles per liter).Your blood glucose may be checked with one or more of the following blood tests:  A fasting blood glucose (FBG) test. You will not be allowed to eat (you will fast) for at least 8 hours before a blood sample is taken. ? A normal range for FBG is 70-100 mg/dl (3.9-5.6 mmol/L).  An A1c (hemoglobin A1c) blood test. This test provides information about blood glucose control over the previous 2?68month.  An oral glucose tolerance test (OGTT). This test measures your blood  glucose twice: ? After fasting. This is your baseline level. ? Two hours after you drink a beverage that contains glucose.  You may be diagnosed with prediabetes:  If your FBG is 100?125 mg/dL (5.6-6.9 mmol/L).  If your A1c level is 5.7?6.4%.  If your OGGT result is 140?199 mg/dL (7.8-11 mmol/L).  These blood tests may be repeated to confirm your diagnosis. What happens if blood glucose is too high? The pancreas produces a hormone (insulin) that helps move glucose from the bloodstream into cells. When cells in the body do not respond properly to insulin that the body makes (insulin resistance), excess glucose builds up in the blood instead of going into cells. As a result, high blood glucose (hyperglycemia) can develop, which can cause many complications. This is a symptom of prediabetes. What can happen if blood glucose stays higher than normal for a long time? Having high blood glucose for a long time is dangerous. Too much glucose in your blood can damage your nerves and blood vessels. Long-term damage can lead to complications from diabetes, which may include:  Heart disease.  Stroke.  Blindness.  Kidney disease.  Depression.  Poor circulation in the feet and legs, which could lead to surgical removal (amputation) in severe cases.  How can prediabetes be prevented from turning into type 2 diabetes?  To help prevent type 2 diabetes, take the following actions:  Be physically active. ? Do moderate-intensity physical activity for at least 30 minutes on at least 5 days of the week, or as much as told by your health care provider. This could be brisk walking, biking, or water aerobics. ? Ask your health care provider what  activities are safe for you. A mix of physical activities may be best, such as walking, swimming, cycling, and strength training.  Lose weight as told by your health care provider. ? Losing 5-7% of your body weight can reverse insulin resistance. ? Your health  care provider can determine how much weight loss is best for you and can help you lose weight safely.  Follow a healthy meal plan. This includes eating lean proteins, complex carbohydrates, fresh fruits and vegetables, low-fat dairy products, and healthy fats. ? Follow instructions from your health care provider about eating or drinking restrictions. ? Make an appointment to see a diet and nutrition specialist (registered dietitian) to help you create a healthy eating plan that is right for you.  Do not smoke or use any tobacco products, such as cigarettes, chewing tobacco, and e-cigarettes. If you need help quitting, ask your health care provider.  Take over-the-counter and prescription medicines as told by your health care provider. You may be prescribed medicines that help lower the risk of type 2 diabetes.  This information is not intended to replace advice given to you by your health care provider. Make sure you discuss any questions you have with your health care provider. Document Released: 04/18/2015 Document Revised: 06/02/2015 Document Reviewed: 02/15/2015 Elsevier Interactive Patient Education  2018 [employer].   Fat and Cholesterol Restricted Diet High levels of fat and cholesterol in your blood may lead to various health problems, such as diseases of the heart, blood vessels, gallbladder, liver, and pancreas. Fats are concentrated sources of energy that come in various forms. Certain types of fat, including saturated fat, may be harmful in excess. Cholesterol is a substance needed by your body in small amounts. Your body makes all the cholesterol it needs. Excess cholesterol comes from the food you eat. When you have high levels of cholesterol and saturated fat in your blood, health problems can develop because the excess fat and cholesterol will gather along the walls of your blood vessels, causing them to narrow. Choosing the right foods will help you control your intake of fat and  cholesterol. This will help keep the levels of these substances in your blood within normal limits and reduce your risk of disease. What is my plan? Your health care provider recommends that you:  Limit your fat intake to ______% or less of your total calories per day.  Limit the amount of cholesterol in your diet to less than _________mg per day.  Eat 20-30 grams of fiber each day.  What types of fat should I choose?  Choose healthy fats more often. Choose monounsaturated and polyunsaturated fats, such as olive and canola oil, flaxseeds, walnuts, almonds, and seeds.  Eat more omega-3 fats. Good choices include salmon, mackerel, sardines, tuna, flaxseed oil, and ground flaxseeds. Aim to eat fish at least two times a week.  Limit saturated fats. Saturated fats are primarily found in animal products, such as meats, butter, and cream. Plant sources of saturated fats include palm oil, palm kernel oil, and coconut oil.  Avoid foods with partially hydrogenated oils in them. These contain trans fats. Examples of foods that contain trans fats are stick margarine, some tub margarines, cookies, crackers, and other baked goods. What general guidelines do I need to follow? These guidelines for healthy eating will help you control your intake of fat and cholesterol:  Check food labels carefully to identify foods with trans fats or high amounts of saturated fat.  Fill one half of your plate  with vegetables and green salads.  Fill one fourth of your plate with whole grains. Look for the word "whole" as the first word in the ingredient list.  Fill one fourth of your plate with lean protein foods.  Limit fruit to two servings a day. Choose fruit instead of juice.  Eat more foods that contain fiber, such as apples, broccoli, carrots, beans, peas, and barley.  Eat more home-cooked food and less restaurant, buffet, and fast food.  Limit or avoid alcohol.  Limit foods high in starch and  sugar.  Limit fried foods.  Cook foods using methods other than frying. Baking, boiling, grilling, and broiling are all great options.  Lose weight if you are overweight. Losing just 5-10% of your initial body weight can help your overall health and prevent diseases such as diabetes and heart disease.  What foods can I eat? Grains  Whole grains, such as whole wheat or whole grain breads, crackers, cereals, and pasta. Unsweetened oatmeal, bulgur, barley, quinoa, or brown rice. Corn or whole wheat flour tortillas. Vegetables  Fresh or frozen vegetables (raw, steamed, roasted, or grilled). Green salads. Fruits  All fresh, canned (in natural juice), or frozen fruits. Meats and other protein foods  Ground beef (85% or leaner), grass-fed beef, or beef trimmed of fat. Skinless chicken or [place]. Ground chicken or [place]. Pork trimmed of fat. All fish and seafood. Eggs. Dried beans, peas, or lentils. Unsalted nuts or seeds. Unsalted canned or dry beans. Dairy  Low-fat dairy products, such as skim or 1% milk, 2% or reduced-fat cheeses, low-fat ricotta or cottage cheese, or plain low-fat yo Fats and oils  Tub margarines without trans fats. Light or reduced-fat mayonnaise and salad dressings. Avocado. Olive, canola, sesame, or safflower oils. Natural peanut or almond butter (choose ones without added sugar and oil). The items listed above may not be a complete list of recommended foods or beverages. Contact your dietitian for more options. Foods to avoid Grains  White bread. White pasta. White rice. Cornbread. Bagels, pastries, and croissants. Crackers that contain trans fat. Vegetables  White potatoes. Corn. Creamed or fried vegetables. Vegetables in a cheese sauce. Fruits  Dried fruits. Canned fruit in light or heavy syrup. Fruit juice. Meats and other protein foods  Fatty cuts of meat. Ribs, chicken wings, bacon, sausage, bologna, salami, chitterlings, fatback, hot dogs, bratwurst,  and packaged luncheon meats. Liver and organ meats. Dairy  Whole or 2% milk, cream, half-and-half, and cream cheese. Whole milk cheeses. Whole-fat or sweetened yogurt. Full-fat cheeses. Nondairy creamers and whipped toppings. Processed cheese, cheese spreads, or cheese curds. Beverages  Alcohol. Sweetened drinks (such as sodas, lemonade, and fruit drinks or punches). Fats and oils  Butter, stick margarine, lard, shortening, ghee, or bacon fat. Coconut, palm kernel, or palm oils. Sweets and desserts  Corn syrup, sugars, honey, and molasses. Candy. Jam and jelly. Syrup. Sweetened cereals. Cookies, pies, cakes, donuts, muffins, and ice cream. The items listed above may not be a complete list of foods and beverages to avoid. Contact your dietitian for more information. This information is not intended to replace advice given to you by your health care provider. Make sure you discuss any questions you have with your health care provider. Document Released: 12/25/2004 Document Revised: 01/15/2014 Document Reviewed: 03/25/2013 Elsevier Interactive Patient Education  2017 [employer].

## 2016-10-21 DIAGNOSIS — F17219 Nicotine dependence, cigarettes, with unspecified nicotine-induced disorders: Secondary | ICD-10-CM | POA: Insufficient documentation

## 2016-10-21 DIAGNOSIS — I519 Heart disease, unspecified: Secondary | ICD-10-CM | POA: Insufficient documentation

## 2016-10-21 DIAGNOSIS — R7303 Prediabetes: Secondary | ICD-10-CM | POA: Insufficient documentation

## 2016-10-21 DIAGNOSIS — F329 Major depressive disorder, single episode, unspecified: Secondary | ICD-10-CM | POA: Insufficient documentation

## 2016-10-21 DIAGNOSIS — E785 Hyperlipidemia, unspecified: Secondary | ICD-10-CM | POA: Insufficient documentation

## 2016-10-21 DIAGNOSIS — I351 Nonrheumatic aortic (valve) insufficiency: Secondary | ICD-10-CM | POA: Insufficient documentation

## 2016-10-21 DIAGNOSIS — J449 Chronic obstructive pulmonary disease, unspecified: Secondary | ICD-10-CM | POA: Insufficient documentation

## 2016-10-21 DIAGNOSIS — F32A Depression, unspecified: Secondary | ICD-10-CM | POA: Insufficient documentation

## 2016-10-21 DIAGNOSIS — I5189 Other ill-defined heart diseases: Secondary | ICD-10-CM | POA: Insufficient documentation

## 2016-10-21 DIAGNOSIS — R011 Cardiac murmur, unspecified: Secondary | ICD-10-CM | POA: Insufficient documentation

## 2016-10-21 DIAGNOSIS — R7989 Other specified abnormal findings of blood chemistry: Secondary | ICD-10-CM | POA: Insufficient documentation

## 2016-10-24 ENCOUNTER — Encounter: Payer: Self-pay | Admitting: Physician Assistant

## 2016-11-05 ENCOUNTER — Ambulatory Visit: Payer: Self-pay | Admitting: Physician Assistant

## 2016-11-21 ENCOUNTER — Ambulatory Visit: Payer: Self-pay | Admitting: Physician Assistant

## 2016-12-03 ENCOUNTER — Encounter: Payer: Self-pay | Admitting: Physician Assistant

## 2017-08-14 IMAGING — DX DG CHEST 2V
2 series · 2 of 2 positions shown · non-contrast
Comparison: October 24, 2013.

CLINICAL DATA: Cough, fever.

EXAM:
CHEST  2 VIEW

[chest pa]
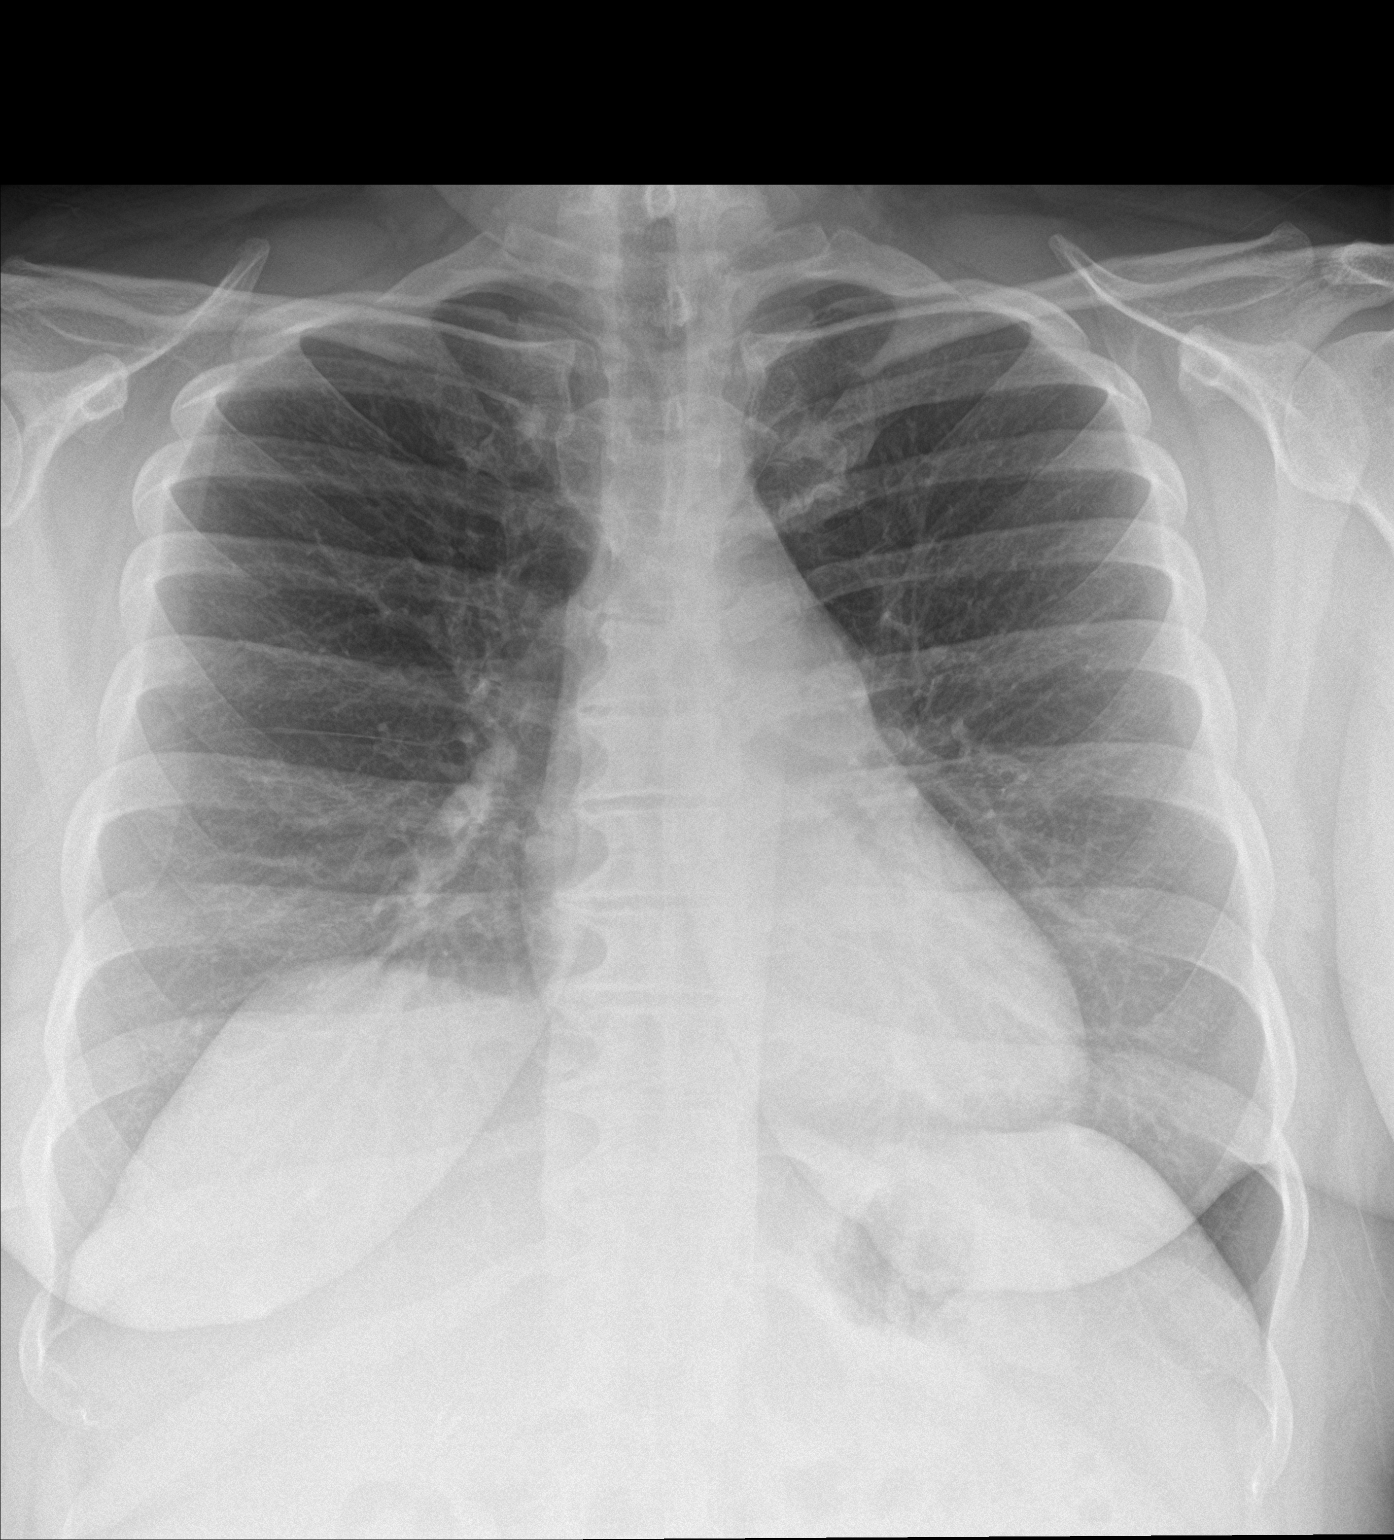

[chest lat]
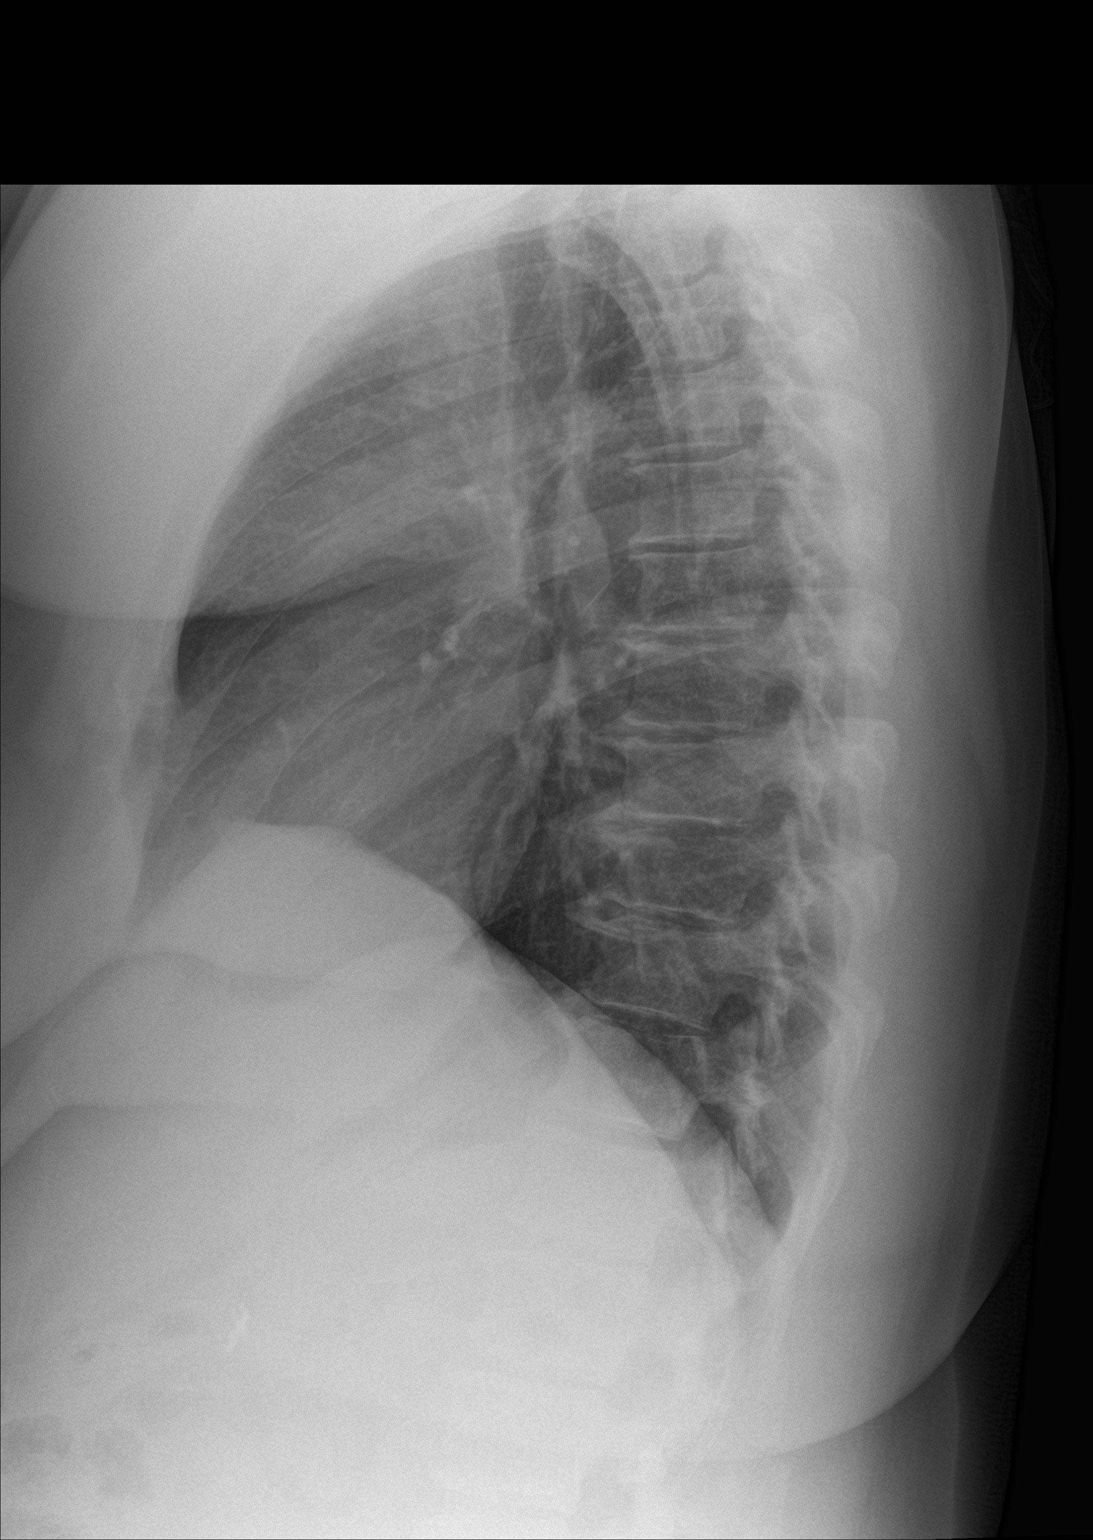

[2 of 2 positions shown; findings below may reference images not displayed]

FINDINGS: The heart size and mediastinal contours are within normal limits.
Both lungs are clear. No pneumothorax or pleural effusion is noted.
Mild multilevel degenerative disc disease is noted in the lower
thoracic spine.
IMPRESSION: No active cardiopulmonary disease.

## 2018-03-04 ENCOUNTER — Encounter (HOSPITAL_COMMUNITY): Payer: Self-pay | Admitting: Emergency Medicine

## 2018-03-04 ENCOUNTER — Other Ambulatory Visit: Payer: Self-pay

## 2018-03-04 ENCOUNTER — Emergency Department (HOSPITAL_COMMUNITY)
Admission: EM | Admit: 2018-03-04 | Discharge: 2018-03-04 | Disposition: A | Discharge status: Home / Self Care | Payer: Self-pay | Attending: Emergency Medicine | Admitting: Emergency Medicine

## 2018-03-04 DIAGNOSIS — J019 Acute sinusitis, unspecified: Secondary | ICD-10-CM | POA: Insufficient documentation

## 2018-03-04 DIAGNOSIS — J329 Chronic sinusitis, unspecified: Secondary | ICD-10-CM

## 2018-03-04 DIAGNOSIS — F17200 Nicotine dependence, unspecified, uncomplicated: Secondary | ICD-10-CM | POA: Insufficient documentation

## 2018-03-04 DIAGNOSIS — K0889 Other specified disorders of teeth and supporting structures: Secondary | ICD-10-CM | POA: Insufficient documentation

## 2018-03-04 HISTORY — DX: Heart failure, unspecified: I50.9

## 2018-03-04 HISTORY — DX: Nonrheumatic mitral (valve) insufficiency: I34.0

## 2018-03-04 MED ORDER — HYDROCODONE-ACETAMINOPHEN 5-325 MG PO TABS: 1.0000 | ORAL_TABLET | ORAL | 0 refills | Status: AC | PRN

## 2018-03-04 MED ORDER — SULFAMETHOXAZOLE-TRIMETHOPRIM 800-160 MG PO TABS: 1.0000 | ORAL_TABLET | Freq: Two times a day (BID) | ORAL | 0 refills | Status: AC

## 2018-03-04 NOTE — Discharge Instructions (Signed)
Follow-up with a dentist as soon as possible for further care and treatment.  For pain use ibuprofen.  Use the narcotic pain reliever only if needed.  Do not drive when taking the narcotic pain reliever.  Your headache and dizziness is probably from a sinus infection.  This is possibly related to the dental infection.  Your condition will be improved if you drink plenty of fluids.  This can improve dizziness by improving your hydration.  It would make sense to follow-up with the primary care doctor you previously saw for further care and treatment.  We reviewed your cardiac echo, that did not show any serious problems, only trivial mitral valve regurgitation.  Return here, if needed, for problems.

## 2018-03-04 NOTE — ED Provider Notes (Addendum)
[facility] EMERGENCY DEPARTMENT Provider Note   CSN: [id number] Arrival date & time: 03/04/18  1156    History   Chief Complaint Chief Complaint  Patient presents with  . Dental Pain    HPI Kristine Mckenzie is a 43 y.o. female.     HPI   She presents for evaluation of right sided dental pain, present for several days, with a tooth that has a large dental carie.  No recent dental care.  She also complains of diffuse headache, postnasal drip, and chest congestion.  She denies fever, chills, nausea, vomiting.  Previously she has been told that she had mitral regurgitation but does not take anything for it.  She has not seen a cardiologist.  She stopped seeing the doctor that told her about the regurgitation because she did not feel she was getting adequate explanations for her problems.  She states she "always has chest pain and abdominal pain."  She denies nausea, vomiting, diarrhea, cough, shortness of breath.  There are no other known modifying factors.  Past Medical History:  Diagnosis Date  . Back pain   . CHF (congestive heart failure) (HCC)   . Degenerative disc disease, lumbar   . Gestational diabetes   . Lumbar herniated disc   . Mitral valve regurgitation   . RA (rheumatoid arthritis) [facility])     Patient Active Problem List   Diagnosis Date Noted  . Depression 10/21/2016  . Prediabetes 10/21/2016  . Cigarette nicotine dependence with nicotine-induced disorder 10/21/2016  . Chronic obstructive pulmonary disease (HCC) 10/21/2016  . Elevated platelet count 10/21/2016  . Hyperlipidemia 10/21/2016  . Murmur, heart 10/21/2016  . Morbid obesity (HCC) 10/21/2016  . Grade I diastolic dysfunction 10/21/2016  . Mild aortic valve regurgitation 10/21/2016    Past Surgical History:  Procedure Laterality Date  . CESAREAN SECTION  09/2008  . CHOLECYSTECTOMY  03/2002     OB History   No obstetric history on file.      Home Medications    Prior to Admission  medications   Medication Sig Start Date End Date Taking? Authorizing Provider  albuterol (PROVENTIL HFA;VENTOLIN HFA) 108 (90 BASE) MCG/ACT inhaler Inhale 2 puffs into the lungs every 4 (four) hours as needed for wheezing or shortness of breath. Patient not taking: Reported on 10/18/2016 10/24/13   Gilda Crease, MD  citalopram (CELEXA) 20 MG tablet Take 1 tablet (20 mg total) by mouth daily. 10/18/16   Jacquelin Hawking, PA-C  HYDROcodone-acetaminophen (NORCO) 5-325 MG tablet Take 1 tablet by mouth every 4 (four) hours as needed for moderate pain. 03/04/18   Mancel Bale, MD  simvastatin (ZOCOR) 20 MG tablet Take 1 tablet (20 mg total) by mouth at bedtime. 10/18/16   Jacquelin Hawking, PA-C  sulfamethoxazole-trimethoprim (BACTRIM DS,SEPTRA DS) 800-160 MG tablet Take 1 tablet by mouth 2 (two) times daily. 03/04/18   Mancel Bale, MD    Family History Family History  Problem Relation Age of Onset  . Diabetes Mother   . Lupus Mother   . Heart failure Mother   . Cancer Father        Pancreatic and lung cancer  . Heart disease Father     Social History Social History   Tobacco Use  . Smoking status: Current Every Day Smoker    Packs/day: 1.00    Years: 27.00    Pack years: 27.00    Types: Cigarettes  . Smokeless tobacco: Never Used  Substance Use Topics  . Alcohol use: No  .  Drug use: No     Allergies   Codeine and Penicillins   Review of Systems Review of Systems  All other systems reviewed and are negative.    Physical Exam Updated Vital Signs BP (!) 137/104   Pulse 82   Temp 98.1 F (36.7 C) (Oral)   Resp 14   Ht 5\' 4"  (1.626 m)   Wt 104.3 kg   LMP 02/24/2018 (Approximate)   SpO2 97%   BMI 39.48 kg/m   Physical Exam Vitals signs and nursing note reviewed.  Constitutional:      General: She is not in acute distress.    Appearance: Normal appearance. She is well-developed. She is obese. She is not ill-appearing or toxic-appearing.  HENT:      Head: Normocephalic and atraumatic.     Comments: Tender to palpation and percussion over the frontal and maxillary sinuses bilaterally.    Right Ear: External ear normal.     Left Ear: External ear normal.     Nose: Nose normal. No congestion or rhinorrhea.     Mouth/Throat:     Mouth: Mucous membranes are moist.     Pharynx: No oropharyngeal exudate or posterior oropharyngeal erythema.  Eyes:     Conjunctiva/sclera: Conjunctivae normal.     Pupils: Pupils are equal, round, and reactive to light.  Neck:     Musculoskeletal: Normal range of motion and neck supple.     Trachea: Phonation normal.  Cardiovascular:     Rate and Rhythm: Normal rate and regular rhythm.     Heart sounds: Normal heart sounds.  Pulmonary:     Effort: Pulmonary effort is normal.     Breath sounds: Normal breath sounds.  Musculoskeletal: Normal range of motion.  Skin:    General: Skin is warm and dry.  Neurological:     Mental Status: She is alert and oriented to person, place, and time.     Cranial Nerves: No cranial nerve deficit.     Sensory: No sensory deficit.     Motor: No abnormal muscle tone.     Coordination: Coordination normal.  Psychiatric:        Mood and Affect: Mood normal.        Behavior: Behavior normal.        Thought Content: Thought content normal.        Judgment: Judgment normal.      ED Treatments / Results  Labs (all labs ordered are listed, but only abnormal results are displayed) Labs Reviewed - No data to display  EKG EKG Interpretation  Date/Time:  Tuesday March 04 2018 12:07:30 EST Ventricular Rate:  93 PR Interval:  170 QRS Duration: 72 QT Interval:  352 QTC Calculation: 437 R Axis:   114 Text Interpretation:  Normal sinus rhythm Left posterior fascicular block Abnormal ECG No old tracing to compare Confirmed by Mancel Bale [id number]) on 03/04/2018 1:04:05 PM   Radiology No results found.  Procedures Procedures (including critical care  time)  Medications Ordered in ED Medications - No data to display   Initial Impression / Assessment and Plan / ED Course  I have reviewed the triage vital signs and the nursing notes.  Pertinent labs & imaging results that were available during my care of the patient were reviewed by me and considered in my medical decision making (see chart for details).         Patient Vitals for the past 24 hrs:  BP Temp Temp src Pulse Resp SpO2 Height  Weight  03/04/18 1230 (!) 137/104 - - 82 14 97 % - -  03/04/18 1218 135/60 - - 78 18 97 % - -  03/04/18 1203 - - - - - - 5\' 4"  (1.626 m) 104.3 kg  03/04/18 1202 (!) 149/88 98.1 F (36.7 C) Oral (!) 101 18 100 % - -    12:39 PM Reevaluation with update and discussion. After initial assessment and treatment, an updated evaluation reveals no change in clinical status.  Findings discussed and questions answered. Mancel BaleElliott Clayburn Weekly   Medical Decision Making: Dental pain, likely secondary to dental cavity and possible infection.  Patient does not have trismus.  Doubt deep tissue infection.  She also has signs and symptoms of acute bacterial sinusitis.  Doubt serious bacterial infection, metabolic instability or impending vascular collapse  CRITICAL CARE-no Performed by: Mancel BaleElliott Edwards Mckelvie  Nursing Notes Reviewed/ Care Coordinated Applicable Imaging Reviewed Interpretation of Laboratory Data incorporated into ED treatment  The patient appears reasonably screened and/or stabilized for discharge and I doubt any other medical condition or other [facility] requiring further screening, evaluation, or treatment in the ED at this time prior to discharge.  Plan: Home Medications-OTC analgesia of choice; Home Treatments-rest, fluids; return here if the recommended treatment, does not improve the symptoms; Recommended follow up-dental follow-up ASAP.  PCP follow-up 1 to 2 weeks.    Final Clinical Impressions(s) / ED Diagnoses   Final diagnoses:  Pain, dental  Sinusitis,  unspecified chronicity, unspecified location    ED Discharge Orders         Ordered    sulfamethoxazole-trimethoprim (BACTRIM DS,SEPTRA DS) 800-160 MG tablet  2 times daily     03/04/18 1244    HYDROcodone-acetaminophen (NORCO) 5-325 MG tablet  Every 4 hours PRN     03/04/18 1244           Mancel BaleWentz, Darion Juhasz, MD 03/04/18 1301    Mancel BaleWentz, [place], MD 03/04/18 1321

## 2018-03-04 NOTE — ED Notes (Signed)
EKG given to [place], MD

## 2018-03-04 NOTE — ED Triage Notes (Signed)
Pt c/o RT sided dental pain 3-4 days. Reports no insurance to see an dentist and states she has 5 "bad teeth." Pt also reports headache x 3 days, dizziness that began yesterday, and epigastricpain intermittently for years, but has worsened for the past 3 days.
